# Patient Record
Sex: Female | Born: 1995 | Race: Black or African American | Hispanic: No | Marital: Single | Smoking: Never smoker
Health system: Southern US, Community
[De-identification: ages and names within clinical notes are randomized; demographics above are authoritative.]

## PROBLEM LIST (undated history)

## (undated) HISTORY — PX: HAND SURGERY: SHX662

---

## 2019-01-10 ENCOUNTER — Encounter (HOSPITAL_COMMUNITY): Payer: Self-pay | Admitting: Emergency Medicine

## 2019-01-10 ENCOUNTER — Ambulatory Visit (HOSPITAL_COMMUNITY)
Admission: EM | Admit: 2019-01-10 | Discharge: 2019-01-10 | Disposition: A | Discharge status: Home / Self Care | Payer: Self-pay | Attending: Family Medicine | Admitting: Family Medicine

## 2019-01-10 DIAGNOSIS — J111 Influenza due to unidentified influenza virus with other respiratory manifestations: Secondary | ICD-10-CM

## 2019-01-10 DIAGNOSIS — R69 Illness, unspecified: Secondary | ICD-10-CM

## 2019-01-10 MED ORDER — ACETAMINOPHEN 325 MG PO TABS
650.0000 mg | ORAL_TABLET | Freq: Once | ORAL | Status: AC
  Administered 2019-01-10: 650 mg via ORAL

## 2019-01-10 MED ORDER — ACETAMINOPHEN 325 MG PO TABS
ORAL_TABLET | ORAL | Status: AC
  Filled 2019-01-10: qty 2

## 2019-01-10 MED ORDER — OSELTAMIVIR PHOSPHATE 75 MG PO CAPS: 75.0000 mg | ORAL_CAPSULE | Freq: Two times a day (BID) | ORAL | 0 refills | Status: AC

## 2019-01-10 NOTE — ED Triage Notes (Signed)
Pt c/o flu like symptoms, headache, fever, body aches. Started yesterday. Has had no medicine.

## 2019-01-10 NOTE — ED Provider Notes (Signed)
[facility]   [id number] 01/10/19 Arrival Time: 1047  ASSESSMENT & PLAN:  1. Influenza-like illness    See AVS for discharge instructions.  Meds ordered this encounter  Medications  . acetaminophen (TYLENOL) tablet 650 mg  . oseltamivir (TAMIFLU) 75 MG capsule    Sig: Take 1 capsule (75 mg total) by mouth 2 (two) times daily for 5 days.    Dispense:  10 capsule    Refill:  0   Declines Rx cough medication. Reports no school/work note needed.  Discussed typical duration of symptoms. OTC symptom care as needed. Ensure adequate fluid intake and rest. May f/u with PCP or here as needed.  Reviewed expectations re: course of current medical issues. Questions answered. Outlined signs and symptoms indicating need for more acute intervention. Patient verbalized understanding. After Visit Summary given.   SUBJECTIVE: History from: patient.  Rebecca Drake is a 23 y.o. female who presents with complaint of nasal congestion, post-nasal drainage, and a persistent dry cough; with a mild sore throat. Onset abrupt, yesterday; with fatigue and with body aches. SOB: none. Wheezing: none. Fever: yes, subjective with chills. Overall normal PO intake without n/v. Known sick contacts: yes, friends have been dx with influenza. No specific or significant aggravating or alleviating factors reported. OTC treatment: none reported.  Received flu shot this year: no.  Social History   Tobacco Use  Smoking Status Never Smoker    ROS: As per HPI.   OBJECTIVE:  Vitals:   01/10/19 1113  BP: 133/78  Pulse: 77  Resp: 18  Temp: (!) 101.4 F (38.6 C)  TempSrc: Temporal  SpO2: 100%     General appearance: alert; appears fatigued HEENT: nasal congestion; clear runny nose; throat mildly irritated; oropharynx moist Neck: supple without LAD CV: RRR Lungs: unlabored respirations, symmetrical air entry without wheezing; cough: moderate and dry Abd: soft Ext: no LE edema Skin: warm  and dry Psychological: alert and cooperative; normal mood and affect   No Known Allergies   Family History  Problem Relation Age of Onset  . Healthy Mother   . Healthy Father    Social History   Socioeconomic History  . Marital status: Single    Spouse name: Not on file  . Number of children: Not on file  . Years of education: Not on file  . Highest education level: Not on file  Occupational History  . Not on file  Social Needs  . Financial resource strain: Not on file  . Food insecurity:    Worry: Not on file    Inability: Not on file  . Transportation needs:    Medical: Not on file    Non-medical: Not on file  Tobacco Use  . Smoking status: Never Smoker  Substance and Sexual Activity  . Alcohol use: Never    Frequency: Never  . Drug use: Never  . Sexual activity: Not on file  Lifestyle  . Physical activity:    Days per week: Not on file    Minutes per session: Not on file  . Stress: Not on file  Relationships  . Social connections:    Talks on phone: Not on file    Gets together: Not on file    Attends religious service: Not on file    Active member of club or organization: Not on file    Attends meetings of clubs or organizations: Not on file    Relationship status: Not on file  . Intimate partner violence:  Fear of current or ex partner: Not on file    Emotionally abused: Not on file    Physically abused: Not on file    Forced sexual activity: Not on file  Other Topics Concern  . Not on file  Social History Narrative  . Not on file           Mardella Layman, MD 01/10/19 1232

## 2019-01-10 NOTE — Discharge Instructions (Addendum)

## 2019-03-19 ENCOUNTER — Emergency Department (HOSPITAL_COMMUNITY): Payer: BLUE CROSS/BLUE SHIELD

## 2019-03-19 ENCOUNTER — Other Ambulatory Visit: Payer: Self-pay

## 2019-03-19 ENCOUNTER — Emergency Department (HOSPITAL_COMMUNITY)
Admission: EM | Admit: 2019-03-19 | Discharge: 2019-03-19 | Disposition: A | Discharge status: Home / Self Care | Payer: BLUE CROSS/BLUE SHIELD | Attending: Emergency Medicine | Admitting: Emergency Medicine

## 2019-03-19 ENCOUNTER — Encounter (HOSPITAL_COMMUNITY): Payer: Self-pay

## 2019-03-19 DIAGNOSIS — Y939 Activity, unspecified: Secondary | ICD-10-CM | POA: Diagnosis not present

## 2019-03-19 DIAGNOSIS — Y929 Unspecified place or not applicable: Secondary | ICD-10-CM | POA: Insufficient documentation

## 2019-03-19 DIAGNOSIS — S60511A Abrasion of right hand, initial encounter: Secondary | ICD-10-CM | POA: Diagnosis not present

## 2019-03-19 DIAGNOSIS — Y999 Unspecified external cause status: Secondary | ICD-10-CM | POA: Diagnosis not present

## 2019-03-19 DIAGNOSIS — R0789 Other chest pain: Secondary | ICD-10-CM | POA: Insufficient documentation

## 2019-03-19 DIAGNOSIS — S0181XA Laceration without foreign body of other part of head, initial encounter: Secondary | ICD-10-CM | POA: Diagnosis not present

## 2019-03-19 DIAGNOSIS — R1084 Generalized abdominal pain: Secondary | ICD-10-CM

## 2019-03-19 DIAGNOSIS — R51 Headache: Secondary | ICD-10-CM | POA: Diagnosis not present

## 2019-03-19 DIAGNOSIS — T07XXXA Unspecified multiple injuries, initial encounter: Secondary | ICD-10-CM | POA: Diagnosis present

## 2019-03-19 DIAGNOSIS — R0781 Pleurodynia: Secondary | ICD-10-CM

## 2019-03-19 DIAGNOSIS — N390 Urinary tract infection, site not specified: Secondary | ICD-10-CM

## 2019-03-19 LAB — URINALYSIS, ROUTINE W REFLEX MICROSCOPIC
Bilirubin Urine: NEGATIVE
Glucose, UA: NEGATIVE mg/dL
Hgb urine dipstick: NEGATIVE
Ketones, ur: NEGATIVE mg/dL
Nitrite: NEGATIVE
Protein, ur: 30 mg/dL — AB
Specific Gravity, Urine: 1.013 (ref 1.005–1.030)
WBC, UA: >50 WBC/hpf — ABNORMAL HIGH (ref 0–5)
pH: 6 (ref 5.0–8.0)

## 2019-03-19 LAB — CBC WITH DIFFERENTIAL/PLATELET
Abs Immature Granulocytes: 0.01 10*3/uL (ref 0.00–0.07)
Basophils Absolute: 0.1 10*3/uL (ref 0.0–0.1)
Basophils Relative: 1 %
Eosinophils Absolute: 0 10*3/uL (ref 0.0–0.5)
Eosinophils Relative: 0 %
HCT: 39.7 % (ref 36.0–46.0)
Hemoglobin: 13.2 g/dL (ref 12.0–15.0)
Immature Granulocytes: 0 %
Lymphocytes Relative: 42 %
Lymphs Abs: 3.2 10*3/uL (ref 0.7–4.0)
MCH: 28.4 pg (ref 26.0–34.0)
MCHC: 33.2 g/dL (ref 30.0–36.0)
MCV: 85.4 fL (ref 80.0–100.0)
Monocytes Absolute: 0.8 10*3/uL (ref 0.1–1.0)
Monocytes Relative: 10 %
Neutro Abs: 3.6 10*3/uL (ref 1.7–7.7)
Neutrophils Relative %: 47 %
Platelets: 226 10*3/uL (ref 150–400)
RBC: 4.65 MIL/uL (ref 3.87–5.11)
RDW: 13.3 % (ref 11.5–15.5)
WBC: 7.7 10*3/uL (ref 4.0–10.5)
nRBC: 0 % (ref 0.0–0.2)

## 2019-03-19 LAB — BASIC METABOLIC PANEL
Anion gap: 9 (ref 5–15)
BUN: 7 mg/dL (ref 6–20)
CO2: 25 mmol/L (ref 22–32)
Calcium: 9.2 mg/dL (ref 8.9–10.3)
Chloride: 107 mmol/L (ref 98–111)
Creatinine, Ser: 0.93 mg/dL (ref 0.44–1.00)
GFR calc Af Amer: >60 mL/min (ref >60)
GFR calc non Af Amer: >60 mL/min (ref >60)
Glucose, Bld: 84 mg/dL (ref 70–99)
Potassium: 3.7 mmol/L (ref 3.5–5.1)
Sodium: 141 mmol/L (ref 135–145)

## 2019-03-19 LAB — I-STAT BETA HCG BLOOD, ED (MC, WL, AP ONLY): I-stat hCG, quantitative: <5 m[IU]/mL (ref <5)

## 2019-03-19 MED ORDER — CEPHALEXIN 500 MG PO CAPS: 500.0000 mg | ORAL_CAPSULE | Freq: Two times a day (BID) | ORAL | 0 refills | Status: AC

## 2019-03-19 MED ORDER — IOPAMIDOL (ISOVUE-300) INJECTION 61%
100.0000 mL | Freq: Once | INTRAVENOUS | Status: AC | PRN
  Administered 2019-03-19: 100 mL via INTRAVENOUS
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
## 2019-04-25 ENCOUNTER — Emergency Department (HOSPITAL_COMMUNITY): Payer: BLUE CROSS/BLUE SHIELD

## 2019-04-25 ENCOUNTER — Other Ambulatory Visit: Payer: Self-pay

## 2019-04-25 ENCOUNTER — Encounter (HOSPITAL_COMMUNITY): Payer: Self-pay | Admitting: Emergency Medicine

## 2019-04-25 ENCOUNTER — Emergency Department (HOSPITAL_COMMUNITY)
Admission: EM | Admit: 2019-04-25 | Discharge: 2019-04-25 | Disposition: A | Discharge status: Home / Self Care | Payer: BLUE CROSS/BLUE SHIELD | Attending: Emergency Medicine | Admitting: Emergency Medicine

## 2019-04-25 DIAGNOSIS — Y929 Unspecified place or not applicable: Secondary | ICD-10-CM | POA: Insufficient documentation

## 2019-04-25 DIAGNOSIS — S6991XA Unspecified injury of right wrist, hand and finger(s), initial encounter: Secondary | ICD-10-CM | POA: Diagnosis present

## 2019-04-25 DIAGNOSIS — Y999 Unspecified external cause status: Secondary | ICD-10-CM | POA: Diagnosis not present

## 2019-04-25 DIAGNOSIS — S62322A Displaced fracture of shaft of third metacarpal bone, right hand, initial encounter for closed fracture: Secondary | ICD-10-CM | POA: Insufficient documentation

## 2019-04-25 DIAGNOSIS — T148XXA Other injury of unspecified body region, initial encounter: Secondary | ICD-10-CM | POA: Insufficient documentation

## 2019-04-25 DIAGNOSIS — Y939 Activity, unspecified: Secondary | ICD-10-CM | POA: Insufficient documentation

## 2019-04-25 MED ORDER — IBUPROFEN 200 MG PO TABS
400.0000 mg | ORAL_TABLET | Freq: Once | ORAL | Status: AC
  Administered 2019-04-25: 400 mg via ORAL
  Filled 2019-04-25: qty 2

## 2019-04-25 NOTE — ED Notes (Signed)
Pt able to move finger; denies number to right hand/fingers.
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
## 2019-09-18 ENCOUNTER — Emergency Department (HOSPITAL_COMMUNITY)
Admission: EM | Admit: 2019-09-18 | Discharge: 2019-09-18 | Disposition: A | Discharge status: Home / Self Care | Payer: BLUE CROSS/BLUE SHIELD | Attending: Emergency Medicine | Admitting: Emergency Medicine

## 2019-09-18 ENCOUNTER — Encounter (HOSPITAL_COMMUNITY): Payer: Self-pay | Admitting: *Deleted

## 2019-09-18 ENCOUNTER — Other Ambulatory Visit: Payer: Self-pay

## 2019-09-18 DIAGNOSIS — N760 Acute vaginitis: Secondary | ICD-10-CM | POA: Insufficient documentation

## 2019-09-18 DIAGNOSIS — B9689 Other specified bacterial agents as the cause of diseases classified elsewhere: Secondary | ICD-10-CM

## 2019-09-18 DIAGNOSIS — R21 Rash and other nonspecific skin eruption: Secondary | ICD-10-CM | POA: Insufficient documentation

## 2019-09-18 LAB — URINALYSIS, ROUTINE W REFLEX MICROSCOPIC
Bacteria, UA: NONE SEEN
Bilirubin Urine: NEGATIVE
Glucose, UA: NEGATIVE mg/dL
Hgb urine dipstick: NEGATIVE
Ketones, ur: NEGATIVE mg/dL
Nitrite: NEGATIVE
Protein, ur: NEGATIVE mg/dL
Specific Gravity, Urine: 1.033 — ABNORMAL HIGH (ref 1.005–1.030)
pH: 6 (ref 5.0–8.0)

## 2019-09-18 LAB — WET PREP, GENITAL
Sperm: NONE SEEN
Trich, Wet Prep: NONE SEEN
Yeast Wet Prep HPF POC: NONE SEEN

## 2019-09-18 LAB — HIV ANTIBODY (ROUTINE TESTING W REFLEX): HIV Screen 4th Generation wRfx: NONREACTIVE

## 2019-09-18 LAB — RPR: RPR Ser Ql: NONREACTIVE

## 2019-09-18 LAB — POC URINE PREG, ED: Preg Test, Ur: NEGATIVE

## 2019-09-18 MED ORDER — PREDNISONE 10 MG PO TABS: 10.0000 mg | ORAL_TABLET | Freq: Two times a day (BID) | ORAL | 0 refills | Status: AC

## 2019-09-18 MED ORDER — FAMOTIDINE 20 MG PO TABS: 20.0000 mg | ORAL_TABLET | Freq: Two times a day (BID) | ORAL | 0 refills | Status: AC

## 2019-09-18 MED ORDER — METRONIDAZOLE 500 MG PO TABS: 500.0000 mg | ORAL_TABLET | Freq: Two times a day (BID) | ORAL | 0 refills | Status: AC

## 2019-09-18 MED ORDER — DIPHENHYDRAMINE HCL 25 MG PO TABS: 25.0000 mg | ORAL_TABLET | Freq: Four times a day (QID) | ORAL | 0 refills | Status: AC | PRN

## 2019-09-18 NOTE — ED Provider Notes (Signed)
[facility] EMERGENCY DEPARTMENT Provider Note   CSN: [id number] Arrival date & time: 09/18/19  0831     History   Chief Complaint Chief Complaint  Patient presents with   Rash   Vaginal Bleeding    HPI Rebecca Drake is a 23 y.o. female with no known past medical history presents to emergency department today with chief complaint of rash and vaginal discharge. States the rash been present x4 days.  Rash is located on her bilateral forearms, neck and ears.  She reports associated itching.  She tried taking Tylenol at home without symptom relief. Denies fever, chills, contacts with persons with similar rash, or any changes in lotions/soaps/detergents, exposure to animal or plant irritants, and denies swelling or purulent discharge. No new medications. No recent travel. No recent tick bites. No involvement to palms/soles or between webspaces.    She is also reporting vaginal discharge x several months, unable to say how many.  The triage note comments on vaginal bleeding however patient is denying vaginal bleeding and states she has vaginal discharge instead.  She describes the discharge as thin and white.  She is sexually active with 1 female partner, she is not concerned for STIs.  Denies past medical history of STIs. She denies fever, chills, chest pain, shortness of breath, abdominal pain, nausea, vomiting, gross hematuria, vaginal bleeding, pelvic pain, dysuria, diarrhea.     History reviewed. No pertinent past medical history.  There are no active problems to display for this patient.   Past Surgical History:  Procedure Laterality Date   HAND SURGERY Right      OB History   No obstetric history on file.      Home Medications    Prior to Admission medications   Medication Sig Start Date End Date Taking? Authorizing Provider  diphenhydrAMINE (BENADRYL) 25 MG tablet Take 1 tablet (25 mg total) by mouth every 6 (six) hours as needed for up to 7 days.  09/18/19 09/25/19  Nnamdi Dacus E, PA-C  famotidine (PEPCID) 20 MG tablet Take 1 tablet (20 mg total) by mouth 2 (two) times daily for 4 days. 09/18/19 09/22/19  Keon Benscoter E, PA-C  metroNIDAZOLE (FLAGYL) 500 MG tablet Take 1 tablet (500 mg total) by mouth 2 (two) times daily. 09/18/19   Majestic Molony E, PA-C  predniSONE (DELTASONE) 10 MG tablet Take 1 tablet (10 mg total) by mouth 2 (two) times daily for 4 days. 09/18/19 09/22/19  Keiva Dina, Harley Hallmark, PA-C    Family History Family History  Problem Relation Age of Onset   Healthy Mother    Healthy Father     Social History Social History   Tobacco Use   Smoking status: Never Smoker   Smokeless tobacco: Never Used  Substance Use Topics   Alcohol use: Never    Frequency: Never   Drug use: Never     Allergies   Patient has no known allergies.   Review of Systems Review of Systems  Constitutional: Negative for chills and fever.  HENT: Negative for congestion, ear discharge, ear pain, sinus pressure, sinus pain and sore throat.   Eyes: Negative for pain and redness.  Respiratory: Negative for cough and shortness of breath.   Cardiovascular: Negative for chest pain.  Gastrointestinal: Negative for abdominal pain, constipation, diarrhea, nausea and vomiting.  Genitourinary: Positive for vaginal discharge. Negative for dysuria, frequency, hematuria, pelvic pain, vaginal bleeding and vaginal pain.  Musculoskeletal: Negative for back pain and neck pain.  Skin: Positive for  rash. Negative for wound.  Allergic/Immunologic: Negative for immunocompromised state.  Neurological: Negative for weakness, numbness and headaches.     Physical Exam Updated Vital Signs BP 131/62 (BP Location: Right Arm)    Pulse (!) 57    Temp 98.7 F (37.1 C) (Oral)    Resp 18    Ht 5\' 8"  (1.727 m)    Wt 74.8 kg    LMP 08/23/2019    SpO2 98%    BMI 25.09 kg/m   Physical Exam Vitals signs and nursing note reviewed.  Constitutional:        General: She is not in acute distress.    Appearance: She is not ill-appearing.  HENT:     Head: Normocephalic and atraumatic.     Right Ear: Tympanic membrane and external ear normal.     Left Ear: Tympanic membrane and external ear normal.     Nose: Nose normal.     Mouth/Throat:     Mouth: Mucous membranes are moist.     Pharynx: Oropharynx is clear.  Eyes:     General: No scleral icterus.       Right eye: No discharge.        Left eye: No discharge.     Extraocular Movements: Extraocular movements intact.     Conjunctiva/sclera: Conjunctivae normal.     Pupils: Pupils are equal, round, and reactive to light.  Neck:     Musculoskeletal: Normal range of motion.     Vascular: No JVD.  Cardiovascular:     Rate and Rhythm: Normal rate and regular rhythm.     Pulses: Normal pulses.          Radial pulses are 2+ on the right side and 2+ on the left side.     Heart sounds: Normal heart sounds.  Pulmonary:     Comments: Lungs clear to auscultation in all fields. Symmetric chest rise. No wheezing, rales, or rhonchi. Abdominal:     Tenderness: There is no right CVA tenderness or left CVA tenderness.     Comments: Abdomen is soft, non-distended, and non-tender in all quadrants. No rigidity, no guarding. No peritoneal signs.  Genitourinary:    Comments: Normal external genitalia. No pain with speculum insertion. Closed cervical os with normal appearance - no rash or lesions. No significant discharge or bleeding noted from cervix or in vaginal vault. On bimanual examination no adnexal tenderness or cervical motion tenderness. Chaperone 10/23/2019 NT present during exam.  Musculoskeletal: Normal range of motion.  Skin:    General: Skin is warm and dry.     Capillary Refill: Capillary refill takes less than 2 seconds.     Findings: Rash present.     Comments: Rash consistent with hives. No blisters, no pustules, no warmth, no draining sinus tracts, no superficial abscesses, no bullous  impetigo, no vesicles, no desquamation, no target lesions with dusky purpura or a central bulla. Not tender to touch.   Neurological:     Mental Status: She is oriented to person, place, and time.     GCS: GCS eye subscore is 4. GCS verbal subscore is 5. GCS motor subscore is 6.     Comments: Fluent speech, no facial droop.  Psychiatric:        Behavior: Behavior normal.      ED Treatments / Results  Labs (all labs ordered are listed, but only abnormal results are displayed) Labs Reviewed  WET PREP, GENITAL - Abnormal; Notable for the following components:  Result Value   Clue Cells Wet Prep HPF POC PRESENT (*)    WBC, Wet Prep HPF POC MANY (*)    All other components within normal limits  URINALYSIS, ROUTINE W REFLEX MICROSCOPIC - Abnormal; Notable for the following components:   Specific Gravity, Urine 1.033 (*)    Leukocytes,Ua SMALL (*)    All other components within normal limits  RPR  HIV ANTIBODY (ROUTINE TESTING W REFLEX)  POC URINE PREG, ED  GC[facility]CHLAMYDIA PROBE AMP [facility]    EKG None  Radiology No results found.  Procedures Procedures (including critical care time)  Medications Ordered in ED Medications - No data to display   Initial Impression / Assessment and Plan / ED Course  I have reviewed the triage vital signs and the nursing notes.  Pertinent labs & imaging results that were available during my care of the patient were reviewed by me and considered in my medical decision making (see chart for details).   Patient seen and examined. Patient nontoxic appearing, in no apparent distress, vitals WNL. Pt has a patent airway without stridor and is handling secretions without difficulty; no angioedema. Lungs are clear to auscultation all fields, abdomen is nontender, no peritoneal signs.  Pelvic exam performed with a chaperone.  There was thin white discharge in vaginal vault.  Wet prep is positive for clue cells and WBC.  Will treat for  bacterial vaginosis.  Had lengthy discussion with patient regarding STI treatment.  She is not concerned and declines prophylactic treatment.  She is aware she has test pending and will be contacted if positive.  If they are positive she will inform her partner and both will seek treatment.  Rash consistent with hives. Patient denies any difficulty breathing or swallowing.   No blisters, no pustules, no warmth, no draining sinus tracts, no superficial abscesses, no bullous impetigo, no vesicles, no desquamation, no target lesions with dusky purpura or a central bulla. Not tender to touch. No concern for superimposed infection. No concern for SJS, TEN, TSS, tick borne illness, syphilis or other life-threatening condition. Will discharge home with short course of steroids, pepcid and recommend Benadryl as needed for pruritis.  Portions of this note were generated with Scientist, clinical (histocompatibility and immunogenetics)Dragon dictation software. Dictation errors may occur despite best attempts at proofreading.    Final Clinical Impressions(s) / ED Diagnoses   Final diagnoses:  Rash  Bacterial vaginosis    ED Discharge Orders         Ordered    metroNIDAZOLE (FLAGYL) 500 MG tablet  2 times daily     09/18/19 1142    predniSONE (DELTASONE) 10 MG tablet  2 times daily     09/18/19 1142    famotidine (PEPCID) 20 MG tablet  2 times daily     09/18/19 1142    diphenhydrAMINE (BENADRYL) 25 MG tablet  Every 6 hours PRN     09/18/19 1142           Sherene Sireslbrizze, Telia Amundson E, PA-C 09/18/19 1150    Gerhard MunchLockwood, Robert, MD 09/18/19 1657

## 2019-09-18 NOTE — ED Triage Notes (Signed)
Pt states rash x 4 days to neck, arms and face.  Also c/o more than normal vaginal discharge, that, since she is here, she'd like to get checked out.

## 2019-09-18 NOTE — Discharge Instructions (Addendum)
You have been seen today for rash and vaginal discharge. Please read and follow all provided instructions. Return to the emergency room for worsening condition or new concerning symptoms.    Your pelvic exam today shows you have bacterial vaginosis.  1. Medications:  Prescription sent to your pharmacy for Flagyl.  This is an antibiotic used to treat bacterial vaginosis.  You cannot drink alcohol while taking this medicine as it will cause you to become violently ill with throwing up. -Prescriptions sent for prednisone, pepcid, and benadryl. These are used to treat your rash. Please take as prescribed. The benadryl is used as needed for itching.  Take medications as prescribed. Please review all of the medicines and only take them if you do not have an allergy to them.   2. Treatment: rest, drink plenty of fluids  3. Follow Up: Please follow up with your primary doctor in 2-5 days for discussion of your diagnoses and further evaluation after today's visit; Call today to arrange your follow up.  If you do not have a primary care doctor use the resource guide provided to find one;   It is also a possibility that you have an allergic reaction to any of the medicines that you have been prescribed - Everybody reacts differently to medications and while MOST people have no trouble with most medicines, you may have a reaction such as nausea, vomiting, rash, swelling, shortness of breath. If this is the case, please stop taking the medicine immediately and contact your physician.  ?

## 2019-09-21 LAB — GC/CHLAMYDIA PROBE AMP (~~LOC~~) NOT AT ARMC
Chlamydia: NEGATIVE
Neisseria Gonorrhea: NEGATIVE
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
## 2021-03-03 ENCOUNTER — Other Ambulatory Visit: Payer: Self-pay

## 2021-03-03 ENCOUNTER — Encounter (HOSPITAL_BASED_OUTPATIENT_CLINIC_OR_DEPARTMENT_OTHER): Payer: Self-pay | Admitting: Emergency Medicine

## 2021-03-03 ENCOUNTER — Emergency Department (HOSPITAL_BASED_OUTPATIENT_CLINIC_OR_DEPARTMENT_OTHER)
Admission: EM | Admit: 2021-03-03 | Discharge: 2021-03-03 | Disposition: A | Discharge status: Home / Self Care | Payer: BLUE CROSS/BLUE SHIELD | Attending: Emergency Medicine | Admitting: Emergency Medicine

## 2021-03-03 DIAGNOSIS — S40212A Abrasion of left shoulder, initial encounter: Secondary | ICD-10-CM | POA: Insufficient documentation

## 2021-03-03 DIAGNOSIS — S00501A Unspecified superficial injury of lip, initial encounter: Secondary | ICD-10-CM | POA: Diagnosis present

## 2021-03-03 DIAGNOSIS — S01511A Laceration without foreign body of lip, initial encounter: Secondary | ICD-10-CM | POA: Insufficient documentation

## 2021-03-03 DIAGNOSIS — S60511A Abrasion of right hand, initial encounter: Secondary | ICD-10-CM | POA: Diagnosis not present

## 2021-03-03 DIAGNOSIS — S50812A Abrasion of left forearm, initial encounter: Secondary | ICD-10-CM | POA: Insufficient documentation

## 2021-03-03 DIAGNOSIS — S50811A Abrasion of right forearm, initial encounter: Secondary | ICD-10-CM | POA: Insufficient documentation

## 2021-03-03 MED ORDER — LIDOCAINE-EPINEPHRINE 1 %-1:100000 IJ SOLN
10.0000 mL | Freq: Once | INTRAMUSCULAR | Status: DC
  Filled 2021-03-03: qty 1

## 2021-03-03 MED ORDER — AMOXICILLIN-POT CLAVULANATE 875-125 MG PO TABS
1.0000 | ORAL_TABLET | Freq: Once | ORAL | Status: AC
  Administered 2021-03-03: 1 via ORAL
  Filled 2021-03-03: qty 1

## 2021-03-03 MED ORDER — BACITRACIN ZINC 500 UNIT/GM EX OINT
1.0000 "application " | TOPICAL_OINTMENT | Freq: Two times a day (BID) | CUTANEOUS | Status: DC
  Administered 2021-03-03 (×2): 1 via TOPICAL
  Filled 2021-03-03 (×2): qty 28.35

## 2021-03-03 MED ORDER — AMOXICILLIN-POT CLAVULANATE 875-125 MG PO TABS: 1.0000 | ORAL_TABLET | Freq: Two times a day (BID) | ORAL | 0 refills | Status: AC

## 2021-03-03 MED ORDER — AMOXICILLIN-POT CLAVULANATE 875-125 MG PO TABS: 1.0000 | ORAL_TABLET | Freq: Two times a day (BID) | ORAL | 0 refills | Status: DC
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
